# Patient Record
Sex: Female | Born: 1940 | Race: White | Hispanic: No | Smoking: Never smoker
Health system: Southern US, Community
[De-identification: ages and names within clinical notes are randomized; demographics above are authoritative.]

## PROBLEM LIST (undated history)

## (undated) DIAGNOSIS — F419 Anxiety disorder, unspecified: Secondary | ICD-10-CM

## (undated) DIAGNOSIS — I10 Essential (primary) hypertension: Secondary | ICD-10-CM

## (undated) DIAGNOSIS — K219 Gastro-esophageal reflux disease without esophagitis: Secondary | ICD-10-CM

## (undated) DIAGNOSIS — E119 Type 2 diabetes mellitus without complications: Secondary | ICD-10-CM

## (undated) HISTORY — PX: HIP SURGERY: SHX245

## (undated) HISTORY — PX: JOINT REPLACEMENT: SHX530

## (undated) HISTORY — PX: CHOLECYSTECTOMY: SHX55

## (undated) HISTORY — PX: APPENDECTOMY: SHX54

---

## 2000-09-02 ENCOUNTER — Ambulatory Visit (HOSPITAL_COMMUNITY): Admission: RE | Admit: 2000-09-02 | Discharge: 2000-09-02 | Payer: Self-pay | Admitting: Gastroenterology

## 2000-09-02 ENCOUNTER — Encounter: Payer: Self-pay | Admitting: Gastroenterology

## 2002-03-08 ENCOUNTER — Ambulatory Visit (HOSPITAL_BASED_OUTPATIENT_CLINIC_OR_DEPARTMENT_OTHER): Admission: RE | Admit: 2002-03-08 | Discharge: 2002-03-08 | Payer: Self-pay | Admitting: *Deleted

## 2002-03-08 ENCOUNTER — Encounter (INDEPENDENT_AMBULATORY_CARE_PROVIDER_SITE_OTHER): Payer: Self-pay | Admitting: Specialist

## 2008-05-15 ENCOUNTER — Ambulatory Visit: Payer: Self-pay | Admitting: Vascular Surgery

## 2010-10-14 NOTE — Procedures (Signed)
LOWER EXTREMITY VENOUS REFLUX EXAM   INDICATION:  Bilateral varicose veins.   EXAM:  Using color-flow imaging and pulse Doppler spectral analysis, the  right and left common femoral, superficial femoral, popliteal, posterior  tibial, greater and lesser saphenous veins are evaluated.  There is no  evidence suggesting deep venous insufficiency in the right and left  lower extremity.   The right and left saphenofemoral junctions are competent, the left GSV  is competent.   The right and left proximal short saphenous vein demonstrates  competency.   GSV Diameter (used if found to be incompetent only)                                            Right    Left  Proximal Greater Saphenous Vein           cm       cm  Proximal-to-mid-thigh                     cm       cm  Mid thigh                                 cm       cm  Mid-distal thigh                          cm       cm  Distal thigh                              cm       cm  Knee                                      cm       cm   IMPRESSION:  1. Right greater saphenous vein was previously harvested.  2. The left greater saphenous vein is not aneurysmal.  3. The left greater saphenous vein is not tortuous.  4. The deep venous system is competent.  5. The right and left lesser saphenous veins are competent.    ___________________________________________  Quita Skye. Hart Rochester, M.D.   AC/MEDQ  D:  05/16/2008  T:  05/16/2008  Job:  [id number]

## 2010-10-14 NOTE — Consult Note (Signed)
VASCULAR SURGERY CONSULTATION   [name], [name]  DOB:  [birth year]                                       05/15/2008  YQIHK#:[id number]   The patient is a 70 year old female referred for vascular surgery  consultation regarding venous insufficiency.  She has been having  discomfort in her right lower extremity over the last few years.  She  describes this as an aching burning discomfort particularly in the  calves which involves both the left and right side with the right being  worse.  She also has a burning sensation in the feet particularly at  night.  She has a history of thrombophlebitis many years ago in her  right leg.  No history of deep venous thrombosis.  She has had no  bleeding or stasis ulcers in the past and does not have significant  distal edema.  She does elevate her legs which helps her discomfort and  takes occasional Advil for pain.  She has not worn elastic compression  stockings.  She had a vein stripping performed in 1962 in the right leg  for painful varicosities.   PAST MEDICAL HISTORY:  1. Non-insulin-dependent diabetes mellitus.  2. Hypertension.  3. History of mini strokes.  4. Negative for coronary artery disease, COPD, hyperlipidemia.   PAST SURGICAL HISTORY:  1. Right greater saphenous vein stripping 1962.  2. Right knee replacement.  3. Hysterectomy.  4. Tubal ligation.   FAMILY HISTORY:  Positive for coronary artery disease in mother, brother  and sister and diabetes in mother, brother and sister.  Negative for  stroke.   SOCIAL HISTORY:  She is single, has four children, is retired.  She does  not use tobacco or alcohol.   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   ALLERGIES:  To Demerol and IVP dye.   MEDICATIONS:  Please see health history form.   PHYSICAL  EXAMINATION:  Vital signs:  Blood pressure 134/84, heart rate  is 94, respirations 16.  General:  She is a female in no apparent  distress, alert and oriented x3.  Neck:  Neck is supple, 3+ carotid  pulses palpable.  No bruits are audible.  Neurological:  Normal.  No  palpable adenopathy in the neck.  Chest:  Clear to auscultation.  Cardiovascular:  Regular rhythm.  No murmurs.  Abdomen:  Soft, nontender  without masses.  She has 3+ femoral, popliteal and 2+ dorsalis pedis  pulses bilaterally.  Right leg has prominent varicosities in the medial  thigh and calf and also extending down into the lateral calf which seem  to be associated with location of the greater saphenous vein which has  been previously stripped.  There are also some prominent spider and  reticular veins in the ankle and foot area particularly laterally on the  right side.  Left side has some small areas of spider veins in the thigh  and calf with no large varicosities.  There is no hyperpigmentation or  ulceration noted in either leg.   Venous duplex exam revealed the following:  1. Normal deep systems bilaterally.  2. No reflux in the left greater or small saphenous vein.  3. Right great saphenous vein is absent.  4. Right small saphenous vein has no reflux but is somewhat large.   She was evaluated in the standing position to be certain that there was  no reflux in the small saphenous vein.  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  She will begin wearing elastic compression  stockings of 20-30 mm long leg and try elevation and ibuprofen.  If she  has had no improvement in 3 months I think we should proceed with  sclerotherapy of these varicosities in the right leg.   Julia Barker, M.D.  Electronically Signed  JDL/MEDQ  D:  05/15/2008  T:  05/16/2008  Job:  [id number]   cc:   L. Lupe Carney, M.D.

## 2010-10-17 NOTE — Op Note (Signed)
   NAME:  Julia Barker, Julia Barker                          ACCOUNT NO.:  [id number]   MEDICAL RECORD NO.:  [id number]                   PATIENT TYPE:  AMB   LOCATION:  [facility]                                  FACILITY:  [facility]   PHYSICIAN:  Lowell Bouton, M.D.      DATE OF BIRTH:  09/22/1940   DATE OF PROCEDURE:  03/08/2002  DATE OF DISCHARGE:                                 OPERATIVE REPORT   PREOPERATIVE DIAGNOSIS:  Foreign body, right index finger, with pyogenic  granuloma.   POSTOPERATIVE DIAGNOSIS:  Foreign body, right index finger, with pyogenic  granuloma.   PROCEDURE:  Excision of foreign body with pyogenic granuloma, right index  finger.   SURGEON:  Lowell Bouton, M.D.   ANESTHESIA:  0.5% Marcaine local in the minor room.   OPERATIVE FINDINGS:  The patient had a very sensitive lesion over the volar  aspect of the middle phalanx of the right index finger.  It was a round  lesion that was friable and was consistent with a pyogenic granuloma.  She  had a history of a piece of glass in the wound prior to development of the  pyogenic granuloma.   DESCRIPTION OF PROCEDURE:  Under 0.5% Marcaine local anesthesia with a  tourniquet on the right forearm, the right hand was prepped and draped in  the usual fashion in the minor room.  The hand was elevated and the  tourniquet was inflated to 250 mmHg.  A V-shaped incision was made around  the pyogenic granuloma and blunt dissection carried through the subcutaneous  tissues.  Blunt dissection was carried down to the tendon sheath, and no  specific foreign body was identified.  Some tissue was removed and sent to  pathology along with the pyogenic granuloma that was excised to see if a  small foreign body was present that was not visible.  After excising the  pyogenic granuloma, the wound was irrigated with the remaining Marcaine.  The skin was closed with 4-0 nylon sutures, sterile dressings were applied,  and the  tourniquet released with good circulation to the hand.  The patient  was discharged in good condition.                                               Lowell Bouton, M.D.    EMM/MEDQ  D:  03/08/2002  T:  03/09/2002  Job:  [id number]

## 2012-10-19 ENCOUNTER — Other Ambulatory Visit: Payer: Self-pay | Admitting: Neurosurgery

## 2012-10-19 DIAGNOSIS — M549 Dorsalgia, unspecified: Secondary | ICD-10-CM

## 2012-10-19 DIAGNOSIS — M542 Cervicalgia: Secondary | ICD-10-CM

## 2012-10-27 ENCOUNTER — Ambulatory Visit
Admission: RE | Admit: 2012-10-27 | Discharge: 2012-10-27 | Disposition: A | Discharge status: Home / Self Care | Payer: Medicare Other | Source: Ambulatory Visit | Attending: Neurosurgery | Admitting: Neurosurgery

## 2012-10-27 DIAGNOSIS — M549 Dorsalgia, unspecified: Secondary | ICD-10-CM

## 2012-10-27 DIAGNOSIS — M542 Cervicalgia: Secondary | ICD-10-CM

## 2013-06-20 ENCOUNTER — Ambulatory Visit: Payer: Self-pay | Admitting: Podiatrist

## 2013-11-22 ENCOUNTER — Encounter: Payer: Self-pay | Admitting: Gastroenterology

## 2014-04-16 ENCOUNTER — Encounter (HOSPITAL_COMMUNITY): Payer: Self-pay | Admitting: *Deleted

## 2014-04-16 ENCOUNTER — Emergency Department (HOSPITAL_COMMUNITY): Payer: Medicare Other

## 2014-04-16 ENCOUNTER — Emergency Department (HOSPITAL_COMMUNITY)
Admission: EM | Admit: 2014-04-16 | Discharge: 2014-04-16 | Disposition: A | Discharge status: Home / Self Care | Payer: Medicare Other | Attending: Emergency Medicine | Admitting: Emergency Medicine

## 2014-04-16 DIAGNOSIS — Y9389 Activity, other specified: Secondary | ICD-10-CM | POA: Insufficient documentation

## 2014-04-16 DIAGNOSIS — M25552 Pain in left hip: Secondary | ICD-10-CM

## 2014-04-16 DIAGNOSIS — S32029A Unspecified fracture of second lumbar vertebra, initial encounter for closed fracture: Secondary | ICD-10-CM | POA: Insufficient documentation

## 2014-04-16 DIAGNOSIS — Y92010 Kitchen of single-family (private) house as the place of occurrence of the external cause: Secondary | ICD-10-CM | POA: Insufficient documentation

## 2014-04-16 DIAGNOSIS — Z79899 Other long term (current) drug therapy: Secondary | ICD-10-CM | POA: Diagnosis not present

## 2014-04-16 DIAGNOSIS — Y998 Other external cause status: Secondary | ICD-10-CM | POA: Diagnosis not present

## 2014-04-16 DIAGNOSIS — IMO0002 Reserved for concepts with insufficient information to code with codable children: Secondary | ICD-10-CM

## 2014-04-16 DIAGNOSIS — S22069A Unspecified fracture of T7-T8 vertebra, initial encounter for closed fracture: Secondary | ICD-10-CM | POA: Diagnosis not present

## 2014-04-16 DIAGNOSIS — S32019A Unspecified fracture of first lumbar vertebra, initial encounter for closed fracture: Secondary | ICD-10-CM | POA: Insufficient documentation

## 2014-04-16 DIAGNOSIS — W01198A Fall on same level from slipping, tripping and stumbling with subsequent striking against other object, initial encounter: Secondary | ICD-10-CM | POA: Insufficient documentation

## 2014-04-16 DIAGNOSIS — R52 Pain, unspecified: Secondary | ICD-10-CM

## 2014-04-16 DIAGNOSIS — F419 Anxiety disorder, unspecified: Secondary | ICD-10-CM | POA: Diagnosis not present

## 2014-04-16 DIAGNOSIS — Z8719 Personal history of other diseases of the digestive system: Secondary | ICD-10-CM | POA: Insufficient documentation

## 2014-04-16 DIAGNOSIS — E119 Type 2 diabetes mellitus without complications: Secondary | ICD-10-CM | POA: Insufficient documentation

## 2014-04-16 DIAGNOSIS — S79912A Unspecified injury of left hip, initial encounter: Secondary | ICD-10-CM | POA: Diagnosis present

## 2014-04-16 DIAGNOSIS — I10 Essential (primary) hypertension: Secondary | ICD-10-CM | POA: Diagnosis not present

## 2014-04-16 HISTORY — DX: Essential (primary) hypertension: I10

## 2014-04-16 HISTORY — DX: Gastro-esophageal reflux disease without esophagitis: K21.9

## 2014-04-16 HISTORY — DX: Type 2 diabetes mellitus without complications: E11.9

## 2014-04-16 HISTORY — DX: Anxiety disorder, unspecified: F41.9

## 2014-04-16 MED ORDER — OXYCODONE-ACETAMINOPHEN 5-325 MG PO TABS: 1.0000 | ORAL_TABLET | Freq: Once | ORAL | Status: DC

## 2014-04-16 MED ORDER — MORPHINE SULFATE 4 MG/ML IJ SOLN
4.0000 mg | Freq: Once | INTRAMUSCULAR | Status: AC
  Administered 2014-04-16: 4 mg via INTRAVENOUS
  Filled 2014-04-16: qty 1

## 2014-04-16 MED ORDER — ONDANSETRON HCL 4 MG/2ML IJ SOLN
4.0000 mg | Freq: Once | INTRAMUSCULAR | Status: AC
  Administered 2014-04-16: 4 mg via INTRAVENOUS
  Filled 2014-04-16: qty 2

## 2014-04-16 NOTE — ED Notes (Signed)
Patient slipped on Saturday while trying to clean up spill in kitchen, patient grabbed appliance door but it came open and she fell, patient with c/o mid to lower back pain, also left hip pain, patient with history or previous left hip replacement, patient states back pain and side pain makes it difficult for deep breathing

## 2014-04-16 NOTE — Discharge Instructions (Signed)
Return to the ED with any concerns including weakness of legs, not able to urinate, loss of control of bowel or bladder, decreased level of alertness/lethargy, or any other alarming symptoms °

## 2014-04-16 NOTE — ED Notes (Signed)
Discharge instructions reviewed with pt. Pt went home by [facility].

## 2014-04-16 NOTE — ED Notes (Signed)
Patient to xray at this time

## 2014-04-16 NOTE — ED Provider Notes (Signed)
CSN: [id number]     Arrival date & time 04/16/14  1757 History   First MD Initiated Contact with Patient 04/16/14 1807     Chief Complaint  Patient presents with  . Back Pain  . Hip Pain     (Consider location/radiation/quality/duration/timing/severity/associated sxs/prior Treatment) HPI  Pt presenting with c/o back and left hip pain.  Pt states that she nearly fell 2 days ago while at home.  She was bending over to sweep up a broken glass in the kitchen.  She lost her balance and tried to hold onto the refrigerator door,but swung back and hit her back on the sink.  Since that time her upper and lower back has been hurting more.  Left hip has also had increased pain.  She has been having pain in left hip for several months however and walks with a walker.  No head trauma.  She does not take blood thinners.  She takes roxicodone for pain at home.  There are no other associated systemic symptoms, there are no other alleviating or modifying factors.   Past Medical History  Diagnosis Date  . Hypertension   . Diabetes mellitus without complication   . Anxiety   . Acid reflux    Past Surgical History  Procedure Laterality Date  . Cholecystectomy    . Appendectomy    . Hip surgery      bilateral  . Joint replacement     No family history on file. History  Substance Use Topics  . Smoking status: Never Smoker   . Smokeless tobacco: Not on file  . Alcohol Use: No   OB History    No data available     Review of Systems  ROS reviewed and all otherwise negative except for mentioned in HPI    Allergies  Demerol and Ivp dye  Home Medications   Prior to Admission medications   Medication Sig Start Date End Date Taking? Authorizing Provider  ACCU-CHEK AVIVA PLUS test strip Inject 1 strip into the skin daily. 04/03/14  Yes Historical Provider, MD  citalopram (CELEXA) 40 MG tablet Take 40 mg by mouth daily. 03/02/14  Yes Historical Provider, MD  clonazePAM (KLONOPIN) 0.5 MG tablet  Take 0.5 mg by mouth 3 (three) times daily. 03/14/14  Yes Historical Provider, MD  lisinopril (PRINIVIL,ZESTRIL) 40 MG tablet Take 40 mg by mouth daily. 02/26/14  Yes Historical Provider, MD  mirtazapine (REMERON) 15 MG tablet Take 15 mg by mouth every evening. 03/17/14  Yes Historical Provider, MD  oxyCODONE (ROXICODONE) 15 MG immediate release tablet Take 0.5 tablets by mouth every 8 (eight) hours as needed. 03/14/14  Yes Historical Provider, MD  simvastatin (ZOCOR) 20 MG tablet Take 20 mg by mouth daily. 03/17/14  Yes Historical Provider, MD   BP 132/52 mmHg  Pulse 72  Temp(Src) 98.5 F (36.9 C) (Oral)  Resp 18  Ht 5\' 4"  (1.626 m)  Wt 109 lb (49.442 kg)  BMI 18.70 kg/m2  SpO2 94%  Vitals reviewed Physical Exam  Physical Examination: General appearance - alert, well appearing, and in no distress Mental status - alert, oriented to person, place, and time Eyes - no conjunctival injection, no scleral icterus Neck - supple, no midline cervical tenderness Chest - clear to auscultation, no wheezes, rales or rhonchi, symmetric air entry Heart - normal rate, regular rhythm, normal S1, S2, no murmurs, rubs, clicks or gallops Abdomen - soft, nontender, nondistended, no masses or organomegaly Back exam - ttp over midline thoracic and  lumbar spine, no CVA tendernes Neurological - alert, oriented, normal speech, no focal findings or movement disorder noted Musculoskeletal - ttp over left lateral hip, some pain with ROM of left hip, otherwise no joint tenderness, deformity or swelling Extremities - peripheral pulses normal, no pedal edema, no clubbing or cyanosis Skin - normal coloration and turgor, no rashes  ED Course  Procedures (including critical care time) Labs Review Labs Reviewed - No data to display  Imaging Review Dg Thoracic Spine 2 View  04/16/2014   CLINICAL DATA:  Fall 3 days ago, upper back pain, initial encounter  EXAM: THORACIC SPINE - 2 VIEW  COMPARISON:  02/04/2013   FINDINGS: Mild compression deformity of T7 is identified. Vertebral body height is otherwise well maintained. Given the patient's history the possibility of an acute fracture deserve consideration. No other focal abnormality is noted.  IMPRESSION: T7 compression deformity of uncertain age. It was not present and 2014. Nonemergent MRI may be helpful for determining the degree of chronicity.   Electronically Signed   By: Alcide CleverMark  Lukens M.D.   On: 04/16/2014 20:02   Dg Lumbar Spine Complete  04/16/2014   CLINICAL DATA:  Fall 3 days previous with back pain, initial encounter  EXAM: LUMBAR SPINE - COMPLETE 4+ VIEW  COMPARISON:  10/27/2012  FINDINGS: Five lumbar type vertebral bodies are well visualized. Inferior endplate compression of L1 and superior endplate compression of L2 is noted with increased kyphosis. These appear chronic in nature but were not present on the prior exam. No other focal abnormality is seen. Changes of prior hip replacement bilaterally are seen.  IMPRESSION: L1 and L2 compression deformities as described. They appear chronic in nature but were not present in May of 2014. Again nonemergent MRI may be helpful for age determination as clinically indicated.   Electronically Signed   By: Alcide CleverMark  Lukens M.D.   On: 04/16/2014 20:04   Dg Hip Complete Left  04/16/2014   CLINICAL DATA:  Left hip pain following fall, initial encounter  EXAM: LEFT HIP - COMPLETE 2+ VIEW  COMPARISON:  09/08/2013  FINDINGS: Bilateral hip replacements are now seen. No acute fracture or dislocation is noted. No loosening is identified. No soft tissue changes are seen.  IMPRESSION: Postsurgical changes without acute abnormality.   Electronically Signed   By: Alcide CleverMark  Lukens M.D.   On: 04/16/2014 20:07     EKG Interpretation None      MDM   Final diagnoses:  Pain  Compression fracture  Hip pain, left    Pt presenting with c/o back pain and left hip pain.  She had a fall 2 days ago at home- left hip pain has been a  chronic issue for her- but increased after fall- xrays reassuring.  She does have evidence of compression fracture of T7, L1, and L2- uncertain age of fractures.  Pt states she has roxicodone 15mg  tabs at home for pain control.  Given information for followup with ortho and advised to follow up with her PMD as well.  Discharged with strict return precautions.  Pt agreeable with plan.    Ethelda ChickMartha K Linker, MD 04/17/14 [phone]

## 2016-10-31 DIAGNOSIS — R0902 Hypoxemia: Secondary | ICD-10-CM

## 2016-10-31 DIAGNOSIS — I10 Essential (primary) hypertension: Secondary | ICD-10-CM

## 2016-10-31 DIAGNOSIS — J189 Pneumonia, unspecified organism: Secondary | ICD-10-CM

## 2016-10-31 DIAGNOSIS — F0390 Unspecified dementia without behavioral disturbance: Secondary | ICD-10-CM

## 2016-10-31 DIAGNOSIS — R079 Chest pain, unspecified: Secondary | ICD-10-CM

## 2016-11-01 DIAGNOSIS — R079 Chest pain, unspecified: Secondary | ICD-10-CM | POA: Diagnosis not present

## 2016-11-01 DIAGNOSIS — J189 Pneumonia, unspecified organism: Secondary | ICD-10-CM | POA: Diagnosis not present

## 2016-11-01 DIAGNOSIS — I10 Essential (primary) hypertension: Secondary | ICD-10-CM | POA: Diagnosis not present

## 2016-11-01 DIAGNOSIS — R0902 Hypoxemia: Secondary | ICD-10-CM | POA: Diagnosis not present

## 2016-11-02 DIAGNOSIS — I10 Essential (primary) hypertension: Secondary | ICD-10-CM | POA: Diagnosis not present

## 2016-11-02 DIAGNOSIS — J189 Pneumonia, unspecified organism: Secondary | ICD-10-CM | POA: Diagnosis not present

## 2016-11-02 DIAGNOSIS — R079 Chest pain, unspecified: Secondary | ICD-10-CM | POA: Diagnosis not present

## 2016-11-02 DIAGNOSIS — R0902 Hypoxemia: Secondary | ICD-10-CM | POA: Diagnosis not present

## 2016-11-03 DIAGNOSIS — R0902 Hypoxemia: Secondary | ICD-10-CM | POA: Diagnosis not present

## 2016-11-03 DIAGNOSIS — J189 Pneumonia, unspecified organism: Secondary | ICD-10-CM | POA: Diagnosis not present

## 2016-11-03 DIAGNOSIS — I10 Essential (primary) hypertension: Secondary | ICD-10-CM | POA: Diagnosis not present

## 2016-11-03 DIAGNOSIS — R079 Chest pain, unspecified: Secondary | ICD-10-CM | POA: Diagnosis not present

## 2017-07-23 DIAGNOSIS — I10 Essential (primary) hypertension: Secondary | ICD-10-CM | POA: Diagnosis not present

## 2017-07-23 DIAGNOSIS — E119 Type 2 diabetes mellitus without complications: Secondary | ICD-10-CM

## 2017-07-23 DIAGNOSIS — R079 Chest pain, unspecified: Secondary | ICD-10-CM | POA: Diagnosis not present

## 2017-07-24 DIAGNOSIS — R079 Chest pain, unspecified: Secondary | ICD-10-CM | POA: Diagnosis not present

## 2017-07-24 DIAGNOSIS — I10 Essential (primary) hypertension: Secondary | ICD-10-CM | POA: Diagnosis not present

## 2017-07-24 DIAGNOSIS — E119 Type 2 diabetes mellitus without complications: Secondary | ICD-10-CM | POA: Diagnosis not present

## 2019-07-23 DIAGNOSIS — E7800 Pure hypercholesterolemia, unspecified: Secondary | ICD-10-CM

## 2019-07-23 DIAGNOSIS — I10 Essential (primary) hypertension: Secondary | ICD-10-CM

## 2019-07-23 DIAGNOSIS — R079 Chest pain, unspecified: Secondary | ICD-10-CM

## 2019-07-24 DIAGNOSIS — R079 Chest pain, unspecified: Secondary | ICD-10-CM

## 2019-07-24 DIAGNOSIS — E7800 Pure hypercholesterolemia, unspecified: Secondary | ICD-10-CM | POA: Diagnosis not present

## 2019-07-24 DIAGNOSIS — I10 Essential (primary) hypertension: Secondary | ICD-10-CM | POA: Diagnosis not present

## 2020-06-14 ENCOUNTER — Emergency Department (HOSPITAL_COMMUNITY): Payer: Medicare HMO

## 2020-06-14 ENCOUNTER — Other Ambulatory Visit: Payer: Self-pay

## 2020-06-14 ENCOUNTER — Encounter (HOSPITAL_COMMUNITY): Payer: Self-pay

## 2020-06-14 ENCOUNTER — Emergency Department (HOSPITAL_COMMUNITY)
Admission: EM | Admit: 2020-06-14 | Discharge: 2020-06-15 | Disposition: A | Discharge status: Home / Self Care | Payer: Medicare HMO | Attending: Emergency Medicine | Admitting: Emergency Medicine

## 2020-06-14 DIAGNOSIS — R34 Anuria and oliguria: Secondary | ICD-10-CM | POA: Insufficient documentation

## 2020-06-14 DIAGNOSIS — E119 Type 2 diabetes mellitus without complications: Secondary | ICD-10-CM | POA: Insufficient documentation

## 2020-06-14 DIAGNOSIS — R197 Diarrhea, unspecified: Secondary | ICD-10-CM | POA: Insufficient documentation

## 2020-06-14 DIAGNOSIS — Z79899 Other long term (current) drug therapy: Secondary | ICD-10-CM | POA: Insufficient documentation

## 2020-06-14 DIAGNOSIS — K648 Other hemorrhoids: Secondary | ICD-10-CM | POA: Insufficient documentation

## 2020-06-14 DIAGNOSIS — Z7982 Long term (current) use of aspirin: Secondary | ICD-10-CM | POA: Diagnosis not present

## 2020-06-14 DIAGNOSIS — K644 Residual hemorrhoidal skin tags: Secondary | ICD-10-CM

## 2020-06-14 DIAGNOSIS — R509 Fever, unspecified: Secondary | ICD-10-CM | POA: Diagnosis not present

## 2020-06-14 DIAGNOSIS — I10 Essential (primary) hypertension: Secondary | ICD-10-CM | POA: Insufficient documentation

## 2020-06-14 DIAGNOSIS — R5383 Other fatigue: Secondary | ICD-10-CM | POA: Insufficient documentation

## 2020-06-14 DIAGNOSIS — Z20822 Contact with and (suspected) exposure to covid-19: Secondary | ICD-10-CM | POA: Insufficient documentation

## 2020-06-14 LAB — BASIC METABOLIC PANEL
Anion gap: 10 (ref 5–15)
BUN: 17 mg/dL (ref 8–23)
CO2: 25 mmol/L (ref 22–32)
Calcium: 9.1 mg/dL (ref 8.9–10.3)
Chloride: 104 mmol/L (ref 98–111)
Creatinine, Ser: 0.83 mg/dL (ref 0.44–1.00)
GFR, Estimated: >60 mL/min (ref >60)
Glucose, Bld: 105 mg/dL — ABNORMAL HIGH (ref 70–99)
Potassium: 3.4 mmol/L — ABNORMAL LOW (ref 3.5–5.1)
Sodium: 139 mmol/L (ref 135–145)

## 2020-06-14 LAB — CBC
HCT: 41.4 % (ref 36.0–46.0)
Hemoglobin: 14.3 g/dL (ref 12.0–15.0)
MCH: 32.4 pg (ref 26.0–34.0)
MCHC: 34.5 g/dL (ref 30.0–36.0)
MCV: 93.7 fL (ref 80.0–100.0)
Platelets: 308 10*3/uL (ref 150–400)
RBC: 4.42 MIL/uL (ref 3.87–5.11)
RDW: 13.2 % (ref 11.5–15.5)
WBC: 9.5 10*3/uL (ref 4.0–10.5)
nRBC: 0 % (ref 0.0–0.2)

## 2020-06-14 NOTE — ED Triage Notes (Signed)
Pt from [facility] for fatigue, fever, frequent bowel movements for the past 2 days. Rapid test was negative at facility, given tylenol at 1630 today

## 2020-06-15 ENCOUNTER — Emergency Department (HOSPITAL_COMMUNITY): Payer: Medicare HMO

## 2020-06-15 DIAGNOSIS — R197 Diarrhea, unspecified: Secondary | ICD-10-CM | POA: Diagnosis not present

## 2020-06-15 LAB — URINALYSIS, ROUTINE W REFLEX MICROSCOPIC
Bacteria, UA: NONE SEEN
Bilirubin Urine: NEGATIVE
Glucose, UA: NEGATIVE mg/dL
Ketones, ur: 5 mg/dL — AB
Leukocytes,Ua: NEGATIVE
Nitrite: NEGATIVE
Protein, ur: NEGATIVE mg/dL
Specific Gravity, Urine: 1.009 (ref 1.005–1.030)
pH: 7 (ref 5.0–8.0)

## 2020-06-15 LAB — HEPATIC FUNCTION PANEL
ALT: 15 U/L (ref 0–44)
AST: 17 U/L (ref 15–41)
Albumin: 3.6 g/dL (ref 3.5–5.0)
Alkaline Phosphatase: 147 U/L — ABNORMAL HIGH (ref 38–126)
Bilirubin, Direct: 0.2 mg/dL (ref 0.0–0.2)
Indirect Bilirubin: 0.6 mg/dL (ref 0.3–0.9)
Total Bilirubin: 0.8 mg/dL (ref 0.3–1.2)
Total Protein: 7.5 g/dL (ref 6.5–8.1)

## 2020-06-15 LAB — LIPASE, BLOOD: Lipase: 21 U/L (ref 11–51)

## 2020-06-15 LAB — SARS CORONAVIRUS 2 (TAT 6-24 HRS): SARS Coronavirus 2: NEGATIVE

## 2020-06-15 MED ORDER — SODIUM CHLORIDE 0.9 % IV BOLUS
500.0000 mL | Freq: Once | INTRAVENOUS | Status: AC
  Administered 2020-06-15: 500 mL via INTRAVENOUS

## 2020-06-15 MED ORDER — CLONAZEPAM 0.25 MG PO TBDP
0.2500 mg | ORAL_TABLET | Freq: Once | ORAL | Status: AC
  Administered 2020-06-15: 0.25 mg via ORAL
  Filled 2020-06-15: qty 1

## 2020-06-15 MED ORDER — HYDROCODONE-ACETAMINOPHEN 5-325 MG PO TABS
1.0000 | ORAL_TABLET | Freq: Once | ORAL | Status: AC
Start: 2020-06-15 — End: 2020-06-15
  Administered 2020-06-15: 1 via ORAL
  Filled 2020-06-15: qty 1

## 2020-06-15 NOTE — ED Provider Notes (Signed)
[facility] EMERGENCY DEPARTMENT Provider Note   CSN: [id number] Arrival date & time: 06/14/20  1820     History Chief Complaint  Patient presents with  . Fatigue  . Fever    Julia Barker is a 80 y.o. female presenting for evaluation of fever, decreased urination, fatigue, diarrhea.   Pt states for the past few days she has been having 3-4 BMs a day. As she is having frequent BMs, she now has irritated the skin around her rectum, and has blood when wiping. No blood in stools. She had a fever for 101 at the facility today. She reports decreased urination, but is incontinent of urine at baseline. She is currently on abx for a sinus infection, states diarrhea began when she started the abx. She has mild abd discomfort, intermittent.  She has sinus pain, pressure, drainage. She has associated cough. This is not worsening. She has had a negative rapid covid test. She reports no other new medications. She is not taking anything for her sxs.   Additional history obtained from chart review, pt with a h/o htn, dm, anxiety, reflux.   HPI     Past Medical History:  Diagnosis Date  . Acid reflux   . Anxiety   . Diabetes mellitus without complication (HCC)   . Hypertension     There are no problems to display for this patient.   Past Surgical History:  Procedure Laterality Date  . APPENDECTOMY    . CHOLECYSTECTOMY    . HIP SURGERY     bilateral  . JOINT REPLACEMENT       OB History   No obstetric history on file.     No family history on file.  Social History   Tobacco Use  . Smoking status: Never Smoker  Substance Use Topics  . Alcohol use: No  . Drug use: No    Home Medications Prior to Admission medications   Medication Sig Start Date End Date Taking? Authorizing Provider  ACCU-CHEK AVIVA PLUS test strip Inject 1 strip into the skin daily. 04/03/14  Yes [provider]  acetaminophen (TYLENOL) 500 MG tablet Take 500 mg by mouth every  8 (eight) hours as needed for mild pain or fever.   Yes [provider]  amLODipine (NORVASC) 5 MG tablet Take 5 mg by mouth daily. 06/07/20  Yes [provider]  ARTIFICIAL TEAR OP Apply 1 drop to eye in the morning, at noon, and at bedtime.   Yes [provider]  aspirin EC 81 MG tablet Take 81 mg by mouth daily. Swallow whole.   Yes [provider]  atorvastatin (LIPITOR) 40 MG tablet Take 40 mg by mouth at bedtime. 06/07/20  Yes [provider]  calcium carbonate (TUMS - DOSED IN MG ELEMENTAL CALCIUM) 500 MG chewable tablet Chew 2 tablets by mouth every 4 (four) hours as needed (acid reflux).   Yes [provider]  Cholecalciferol (VITAMIN D) 50 MCG (2000 UT) tablet Take 2,000 Units by mouth daily.   Yes [provider]  clonazePAM (KLONOPIN) 0.5 MG tablet Take 0.5 mg by mouth 2 (two) times daily. 03/14/14  Yes [provider]  docusate sodium (COLACE) 100 MG capsule Take 100 mg by mouth at bedtime.   Yes [provider]  feeding supplement, GLUCERNA SHAKE, (GLUCERNA SHAKE) LIQD Take 237 mLs by mouth daily at 2 PM.   Yes [provider]  fluticasone (FLONASE) 50 MCG/ACT nasal spray Place 2 sprays into  both nostrils in the morning. 06/03/20  Yes [provider]  gabapentin (NEURONTIN) 300 MG capsule Take 300 mg by mouth in the morning. 06/14/20  Yes [provider]  gabapentin (NEURONTIN) 400 MG capsule Take 400 mg by mouth at bedtime. 06/03/20  Yes [provider]  guaiFENesin (MUCINEX) 600 MG 12 hr tablet Take 600 mg by mouth 2 (two) times daily.   Yes [provider]  HYDROcodone-acetaminophen (NORCO/VICODIN) 5-325 MG tablet Take 1 tablet by mouth every 8 (eight) hours. 06/06/20  Yes [provider]  Lidocaine 4 % PTCH Apply 1 patch topically 2 times daily at 12 noon and 4 pm.   Yes [provider]  losartan (COZAAR) 50 MG tablet Take 50 mg by mouth 2 (two) times  daily. 06/07/20  Yes [provider]  methocarbamol (ROBAXIN) 500 MG tablet Take 500 mg by mouth 3 (three) times daily. 06/07/20  Yes [provider]  mirtazapine (REMERON) 15 MG tablet Take 15 mg by mouth at bedtime. 03/17/14  Yes [provider]  ondansetron (ZOFRAN) 4 MG tablet Take 4 mg by mouth every 6 (six) hours as needed for nausea or vomiting.   Yes [provider]  Oyster Shell 500 MG TABS Take 500 mg by mouth daily.   Yes [provider]  polycarbophil (FIBERCON) 625 MG tablet Take 1,250 mg by mouth daily.   Yes [provider]  sertraline (ZOLOFT) 50 MG tablet Take 50 mg by mouth daily. 06/07/20  Yes [provider]  white petrolatum (VASELINE) GEL Apply 1 application topically See admin instructions. Apply to anterior nares topically as needed for dryness twice daily as needed.   Yes [provider]  amoxicillin-clavulanate (AUGMENTIN) 875-125 MG tablet Take 1 tablet by mouth 2 (two) times daily. Patient not taking: Reported on 06/15/2020 05/28/20   [provider]    Allergies    Demerol [meperidine], Fish oil, Ivp dye [iodinated diagnostic agents], Other, Protonix [pantoprazole], and Shellfish allergy  Review of Systems   Review of Systems  Constitutional: Positive for fatigue and fever.  HENT: Positive for postnasal drip, sinus pressure and sinus pain.   Respiratory: Positive for cough.   Gastrointestinal: Positive for abdominal pain and diarrhea.  All other systems reviewed and are negative.   Physical Exam Updated Vital Signs BP (!) 148/76   Pulse 92   Temp 97.7 F (36.5 C)   Resp 15   SpO2 93%   Physical Exam Vitals and nursing note reviewed. Exam conducted with a chaperone present.  Constitutional:      General: She is not in acute distress.    Appearance: She is well-developed and well-nourished.     Comments: Resting in the bed in NAD  HENT:     Head: Normocephalic and atraumatic.   Eyes:     Extraocular Movements: Extraocular movements intact and EOM normal.     Conjunctiva/sclera: Conjunctivae normal.     Pupils: Pupils are equal, round, and reactive to light.  Cardiovascular:     Rate and Rhythm: Normal rate and regular rhythm.     Pulses: Normal pulses and intact distal pulses.  Pulmonary:     Effort: Pulmonary effort is normal. No respiratory distress.     Breath sounds: Normal breath sounds. No wheezing.  Abdominal:     General: There is no distension.     Palpations: Abdomen is soft. There is no mass.     Tenderness: There is abdominal tenderness. There is no guarding  or rebound.     Comments: Mild periumbilical ttp. No rigidity, guarding, distention. Negative rebound.   Genitourinary:    Rectum: External hemorrhoid present.     Comments: Cherlyn LabellaS Bertrand, RN present as chaperone. External hemorrhoids noted without bleeding. No ulcerations. Area is clean and free of stool. No gross blood noted.  Musculoskeletal:        General: Normal range of motion.     Cervical back: Normal range of motion and neck supple.  Skin:    General: Skin is warm and dry.     Capillary Refill: Capillary refill takes less than 2 seconds.  Neurological:     Mental Status: She is alert and oriented to person, place, and time.  Psychiatric:        Mood and Affect: Mood and affect normal.     ED Results / Procedures / Treatments   Labs (all labs ordered are listed, but only abnormal results are displayed) Labs Reviewed  BASIC METABOLIC PANEL - Abnormal; Notable for the following components:      Result Value   Potassium 3.4 (*)    Glucose, Bld 105 (*)    All other components within normal limits  URINALYSIS, ROUTINE W REFLEX MICROSCOPIC - Abnormal; Notable for the following components:   Hgb urine dipstick MODERATE (*)    Ketones, ur 5 (*)    All other components within normal limits  HEPATIC FUNCTION PANEL - Abnormal; Notable for the following components:   Alkaline  Phosphatase 147 (*)    All other components within normal limits  SARS CORONAVIRUS 2 (TAT 6-24 HRS)  URINE CULTURE  C DIFFICILE QUICK SCREEN W PCR REFLEX  GASTROINTESTINAL PANEL BY PCR, STOOL (REPLACES STOOL CULTURE)  CBC  LIPASE, BLOOD    EKG EKG Interpretation  Date/Time:  Friday June 14 2020 18:27:18 EST Ventricular Rate:  78 PR Interval:  154 QRS Duration: 82 QT Interval:  398 QTC Calculation: 453 R Axis:   -8 Text Interpretation: Normal sinus rhythm Possible Anterior infarct , age undetermined Abnormal ECG No old tracing to compare Confirmed by Dione BoozeGlick, David ([id number]) on 06/14/2020 11:47:48 PM   Radiology CT ABDOMEN PELVIS WO CONTRAST  Result Date: 06/15/2020 CLINICAL DATA:  Abdominal pain and fever with frequent bowel movements for 2 days. EXAM: CT ABDOMEN AND PELVIS WITHOUT CONTRAST TECHNIQUE: Multidetector CT imaging of the abdomen and pelvis was performed following the standard protocol without IV contrast. COMPARISON:  12/11/2019 CT abdomen/pelvis. FINDINGS: Lower chest: No significant pulmonary nodules or acute consolidative airspace disease. Hepatobiliary: Normal liver size. No liver mass. Cholecystectomy. Bile ducts are stable and within normal post cholecystectomy limits with CBD diameter 6 mm. Pancreas: Normal, with no mass or duct dilation. Spleen: Normal size. No mass. Adrenals/Urinary Tract: Normal adrenals. Duplicated right renal collecting system. Mild fullness of the central right renal collecting system without overt hydronephrosis. No left hydronephrosis. No renal stones. Simple 1.5 cm posterior interpolar left renal cyst. No contour deforming renal masses. Distal ureters obscured by streak artifact from bilateral hip hardware. No appreciable ureteral stones. Normal bladder. Stomach/Bowel: Moderate hiatal hernia. Otherwise normal nondistended stomach. Normal caliber small bowel with no small bowel wall thickening. Appendectomy. Moderate diffuse colonic  diverticulosis, most prominent in the sigmoid colon, with no large bowel wall thickening or acute pericolonic fat stranding. Vascular/Lymphatic: Atherosclerotic nonaneurysmal abdominal aorta. No pathologically enlarged lymph nodes in the abdomen or pelvis. Reproductive: Status post hysterectomy, with no abnormal findings at the vaginal cuff. No adnexal mass. Other: No pneumoperitoneum, ascites  or focal fluid collection. Musculoskeletal: No aggressive appearing focal osseous lesions. Bilateral total hip arthroplasty. Mild L1, severe L2 and severe L3 vertebral compression fractures, chronic and unchanged. Chronic appearing T8 severe vertebral compression fracture. Mild thoracolumbar spondylosis. IMPRESSION: 1. No acute abnormality. No evidence of bowel obstruction or acute bowel inflammation. Moderate diffuse colonic diverticulosis, with no evidence of acute diverticulitis. 2. Moderate hiatal hernia. 3. Duplicated right renal collecting system. Mild fullness of the central right renal collecting system without overt hydronephrosis. No urolithiasis. 4. Chronic thoracolumbar vertebral compression fractures. 5. Aortic Atherosclerosis (ICD10-I70.0). Electronically Signed   By: Delbert PhenixJason A Poff M.D.   On: 06/15/2020 07:53   DG Chest Portable 1 View  Result Date: 06/14/2020 CLINICAL DATA:  Possible COVID EXAM: PORTABLE CHEST 1 VIEW COMPARISON:  05/04/2020 FINDINGS: Low lung volumes. Mild bronchitic changes without focal airspace disease. Stable cardiomediastinal silhouette with aortic atherosclerosis. No pneumothorax IMPRESSION: Low lung volumes with mild bronchitic changes. No focal airspace disease. Electronically Signed   By: Jasmine PangKim  Fujinaga M.D.   On: 06/14/2020 18:46    Procedures Procedures (including critical care time)  Medications Ordered in ED Medications  HYDROcodone-acetaminophen (NORCO/VICODIN) 5-325 MG per tablet 1 tablet (1 tablet Oral Given 06/15/20 0738)  clonazepam (KLONOPIN) disintegrating tablet  0.25 mg (0.25 mg Oral Given 06/15/20 0738)  sodium chloride 0.9 % bolus 500 mL (0 mLs Intravenous Stopped 06/15/20 0837)    ED Course  I have reviewed the triage vital signs and the nursing notes.  Pertinent labs & imaging results that were available during my care of the patient were reviewed by me and considered in my medical decision making (see chart for details).    MDM Rules/Calculators/A&P                          Pt presenting for evaluation of fever, decreased urination, diarrhea. I saw pt 12 hrs after her arrival to the ED. On exam, pt appears nontoxic. She has mild abd pain/discomfort. pulm exam is overall reassuring. As pt is on abx and having diarrhea, consider c diff. Will order cdiff panel/stool panel. Although more likely diarrhea is a side effect of the abx. Less likely intraabd infection, however considering pt's age and abd pain, will obtain ct scan. Labs obtained from triage interpreted by me, overall reassuring. No leukocytosis. Scr reassuring. Will add on lfts. Will order ua and urine cx. cxr viewed and interpreted by me, no pna, pnx, effusion. EKG nonischemic. Will tx with fluids and give home pain and anxiety meds as requested. Case discussed with attending, Dr Anitra LauthPlunkett evaluated the pt.   Urine without signs of infection, culture sent.  Hepatic function and lipase normal.  CT abdomen pelvis without acute or concerning findings.  Discussed with patient.  She is feeling better after fluids.  While we were not able to obtain a C. difficile or stool panel, I have low suspicion for this, and patient can obtain this outpatient.  I discussed symptomatic management, use of probiotics to help with diarrhea.  Encourage close follow-up with PCP.  I feel the diarrhea is likely due to side effects on antibiotics.  At this time, patient appears safe for discharge.  Return precautions given.  Patient states she understands and agrees to plan.   Final Clinical Impression(s) / ED  Diagnoses Final diagnoses:  Diarrhea, unspecified type  External hemorrhoid    Rx / DC Orders ED Discharge Orders    None  Alveria Apley, PA-C 06/15/20 [id number]    Gwyneth Sprout, MD 06/15/20 1422

## 2020-06-15 NOTE — Discharge Instructions (Addendum)
Make sure you are staying well hydrated with water.  Take tylenol as needed for fever and/or abdominal pain.  Take a probiotics daily until diarrhea resolves.  Follow up with your primary care doctor for recheck of your symptoms.  Return to the ER with any new, worsening, or concerning symptoms.

## 2020-06-15 NOTE — ED Notes (Signed)
Pt ambulated in hall with walker, no difficulty noted.

## 2020-06-16 LAB — URINE CULTURE: Culture: <10000 — AB

## 2022-05-23 IMAGING — CT CT ABD-PELV W/O CM
2 of 4 series · 15 of 46 positions shown, 17 images · non-contrast
Comparison: 12/11/2019 CT abdomen/pelvis.

CLINICAL DATA: Abdominal pain and fever with frequent bowel
movements for 2 days.

EXAM:
CT ABDOMEN AND PELVIS WITHOUT CONTRAST
TECHNIQUE: Multidetector CT imaging of the abdomen and pelvis was performed
following the standard protocol without IV contrast.

[Series 3: abd/ pelvis 5.0 i30f 2 (person_name) · axial · 0.81mm/px · z∈[+745,+1170]mm · 12 of 95 slices shown, 14 images]
[im 5/95  soft-tissue]
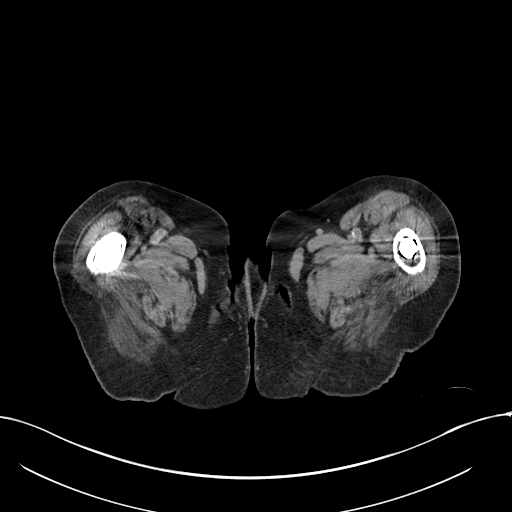
[im 5/95  bone]
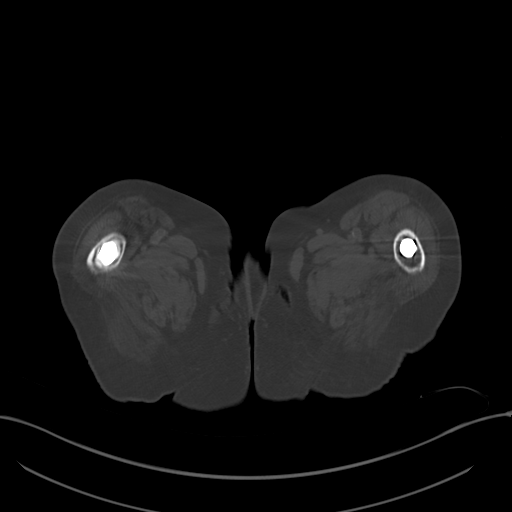
[im 13/95  soft-tissue]
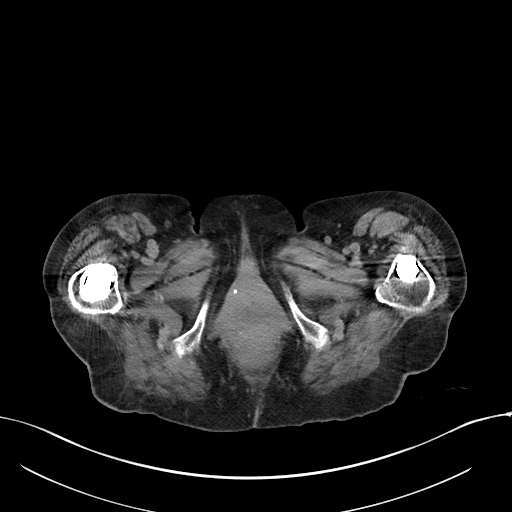
[im 21/95  soft-tissue]
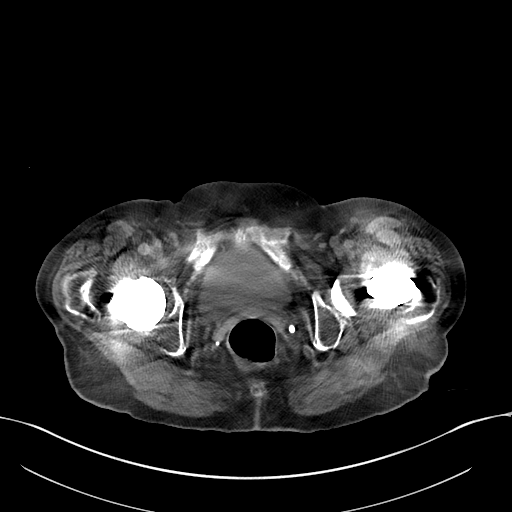
[im 29/95  soft-tissue]
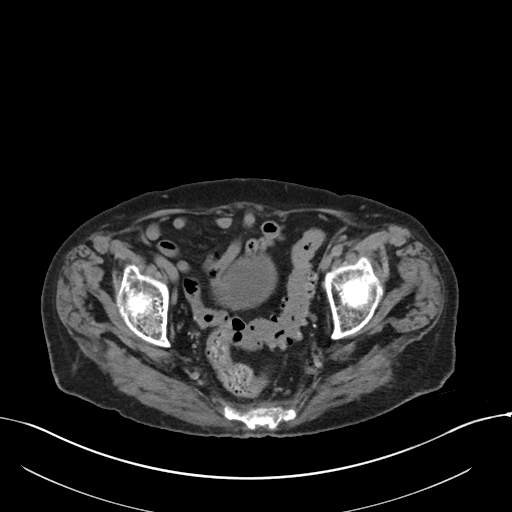
[im 37/95  soft-tissue]
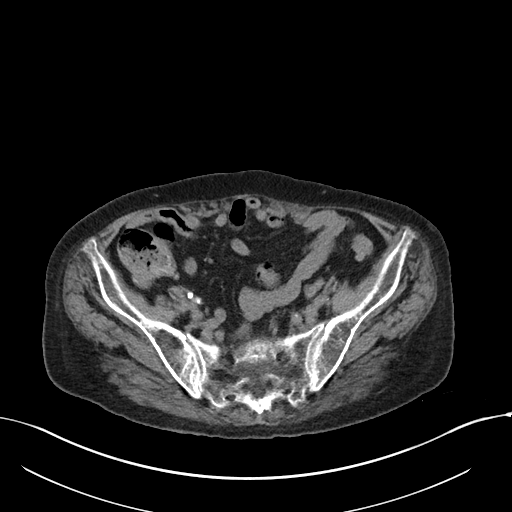
[im 45/95  soft-tissue]
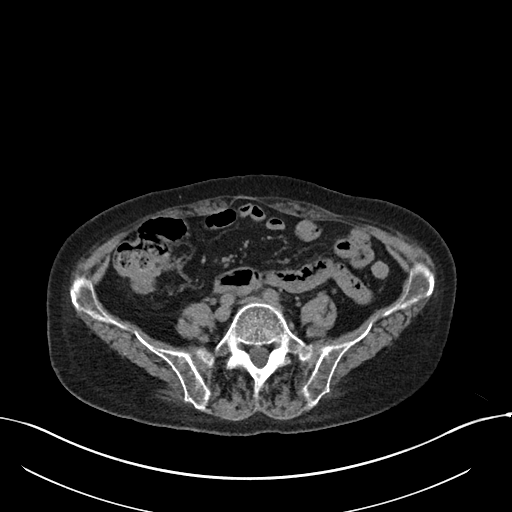
[im 50/95  soft-tissue]
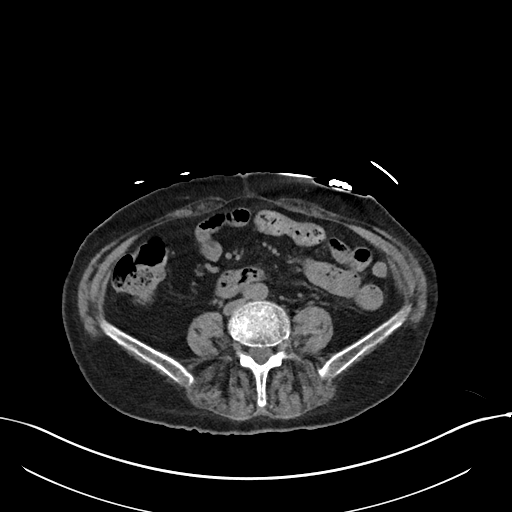
[im 58/95  soft-tissue]
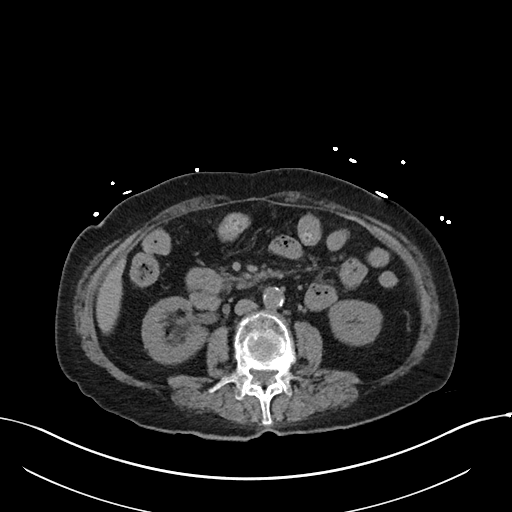
[im 66/95  soft-tissue]
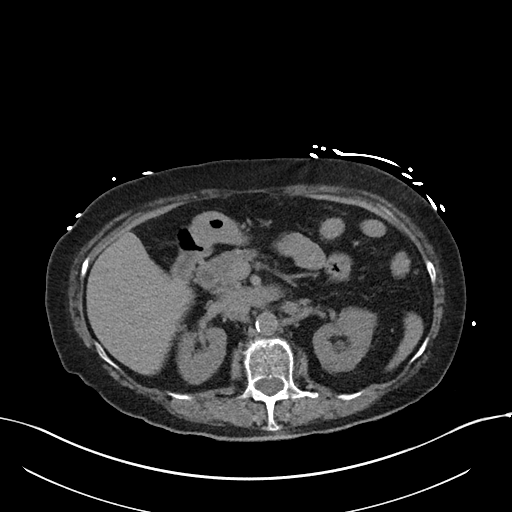
[im 66/95  bone]
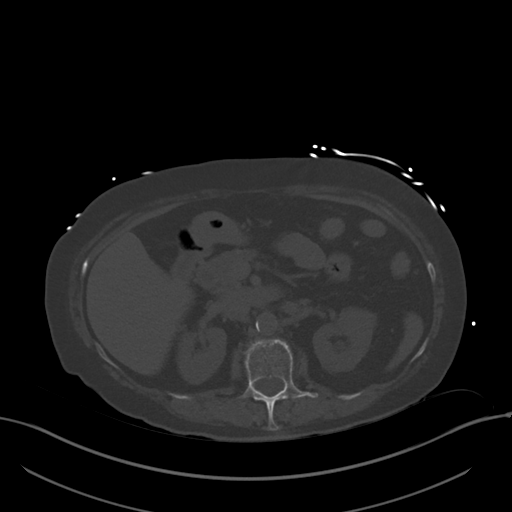
[im 74/95  soft-tissue]
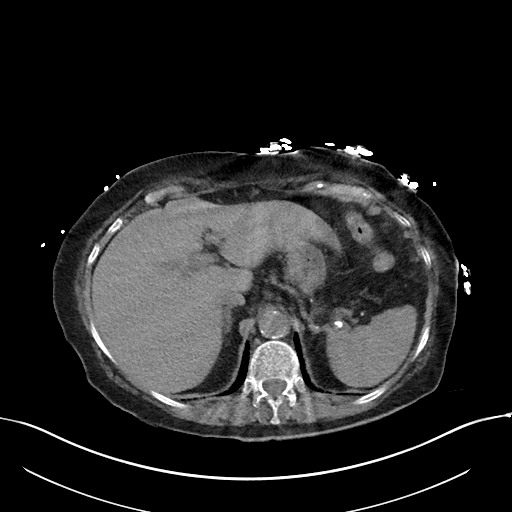
[im 82/95  soft-tissue]
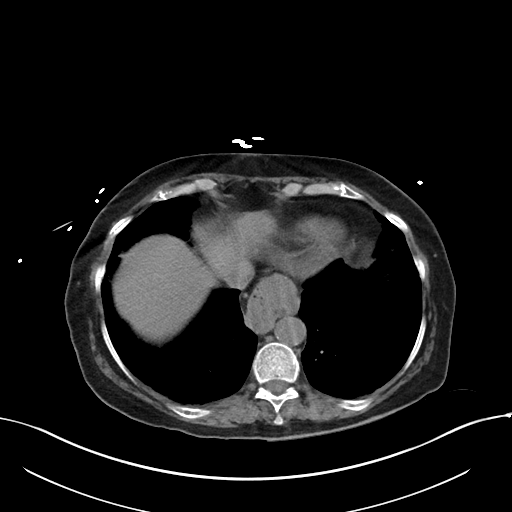
[im 90/95  soft-tissue]
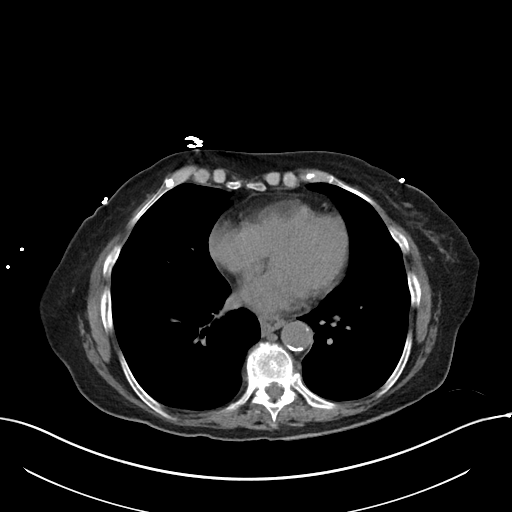

[Series 6: cor st · coronal · 0.76mm/px · 3 of 93 slices shown]
[im 31/93  soft-tissue]
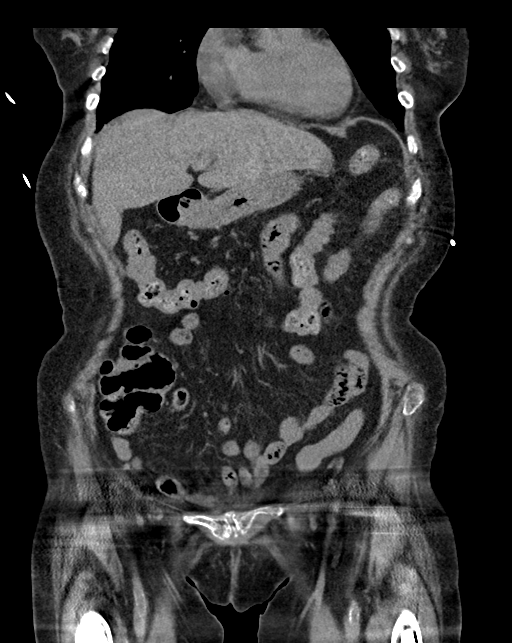
[im 41/93  soft-tissue]
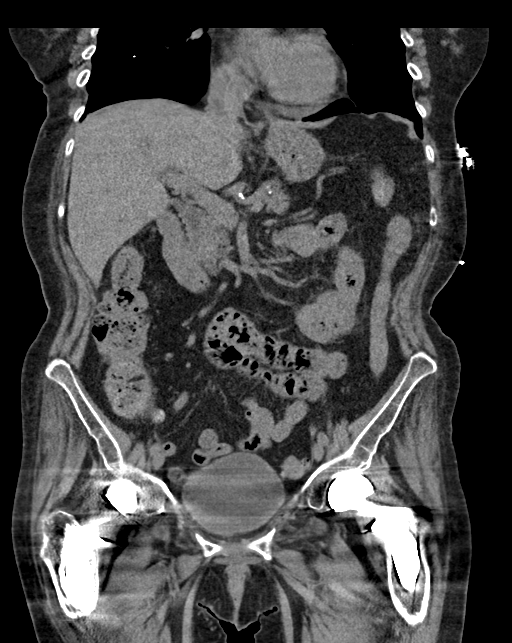
[im 52/93  soft-tissue]
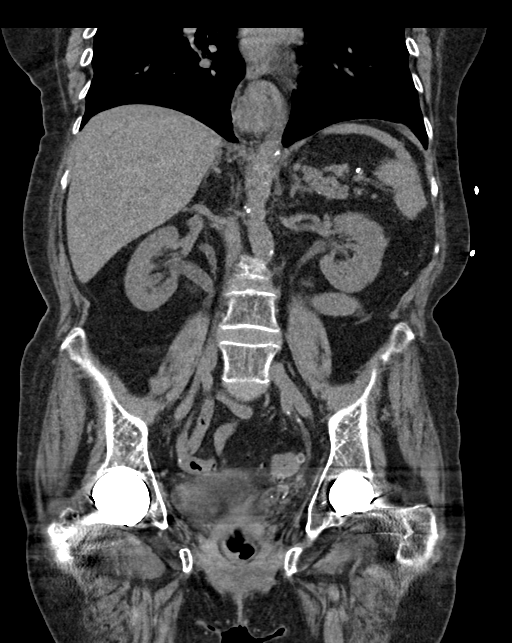

[15 of 46 positions shown; findings below may reference images not displayed]

FINDINGS: Lower chest: No significant pulmonary nodules or acute consolidative
airspace disease.

Hepatobiliary: Normal liver size. No liver mass. Cholecystectomy.
Bile ducts are stable and within normal post cholecystectomy limits
with CBD diameter 6 mm.

Pancreas: Normal, with no mass or duct dilation.

Spleen: Normal size. No mass.

Adrenals/Urinary Tract: Normal adrenals. Duplicated right renal
collecting system. Mild fullness of the central right renal
collecting system without overt hydronephrosis. No left
hydronephrosis. No renal stones. Simple 1.5 cm posterior interpolar
left renal cyst. No contour deforming renal masses. Distal ureters
obscured by streak artifact from bilateral hip hardware. No
appreciable ureteral stones. Normal bladder.

Stomach/Bowel: Moderate hiatal hernia. Otherwise normal nondistended
stomach. Normal caliber small bowel with no small bowel wall
thickening. Appendectomy. Moderate diffuse colonic diverticulosis,
most prominent in the sigmoid colon, with no large bowel wall
thickening or acute pericolonic fat stranding.

Vascular/Lymphatic: Atherosclerotic nonaneurysmal abdominal aorta.
No pathologically enlarged lymph nodes in the abdomen or pelvis.

Reproductive: Status post hysterectomy, with no abnormal findings at
the vaginal cuff. No adnexal mass.

Other: No pneumoperitoneum, ascites or focal fluid collection.

Musculoskeletal: No aggressive appearing focal osseous lesions.
Bilateral total hip arthroplasty. Mild L1, severe L2 and severe L3
vertebral compression fractures, chronic and unchanged. Chronic
appearing T8 severe vertebral compression fracture. Mild
thoracolumbar spondylosis.
IMPRESSION: 1. No acute abnormality. No evidence of bowel obstruction or acute
bowel inflammation. Moderate diffuse colonic diverticulosis, with no
evidence of acute diverticulitis.
2. Moderate hiatal hernia.
3. Duplicated right renal collecting system. Mild fullness of the
central right renal collecting system without overt hydronephrosis.
No urolithiasis.
4. Chronic thoracolumbar vertebral compression fractures.
5. Aortic Atherosclerosis ([id number]).
